# Patient Record
Sex: Female | Born: 1990 | Race: White | Hispanic: No | Marital: Single | Smoking: Never smoker
Health system: Southern US, Community
[De-identification: ages and names within clinical notes are randomized; demographics above are authoritative.]

## PROBLEM LIST (undated history)

## (undated) DIAGNOSIS — Z9109 Other allergy status, other than to drugs and biological substances: Secondary | ICD-10-CM

## (undated) DIAGNOSIS — N390 Urinary tract infection, site not specified: Secondary | ICD-10-CM

## (undated) DIAGNOSIS — N809 Endometriosis, unspecified: Secondary | ICD-10-CM

## (undated) HISTORY — PX: ARTHROSCOPIC REPAIR ACL: SUR80

## (undated) HISTORY — PX: LAPAROSCOPIC ENDOMETRIOSIS FULGURATION: SUR769

---
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
## 2010-04-27 ENCOUNTER — Emergency Department: Payer: Self-pay | Admitting: Emergency Medicine

## 2012-05-14 ENCOUNTER — Emergency Department: Payer: Self-pay | Admitting: Emergency Medicine

## 2012-05-14 LAB — URINALYSIS, COMPLETE
Bilirubin,UR: NEGATIVE
Glucose,UR: NEGATIVE mg/dL (ref 0–75)
Ph: 6 (ref 4.5–8.0)
Protein: 30
Specific Gravity: 1.027 (ref 1.003–1.030)
Squamous Epithelial: 4
WBC UR: 2 /HPF (ref 0–5)

## 2012-05-14 LAB — BASIC METABOLIC PANEL
Anion Gap: 9 (ref 7–16)
BUN: 9 mg/dL (ref 7–18)
Co2: 24 mmol/L (ref 21–32)
Creatinine: 0.94 mg/dL (ref 0.60–1.30)
EGFR (African American): >60
EGFR (Non-African Amer.): >60
Glucose: 101 mg/dL — ABNORMAL HIGH (ref 65–99)
Osmolality: 271 (ref 275–301)
Potassium: 3.3 mmol/L — ABNORMAL LOW (ref 3.5–5.1)
Sodium: 136 mmol/L (ref 136–145)

## 2012-05-14 LAB — CBC
HGB: 15.3 g/dL (ref 12.0–16.0)
MCH: 27.9 pg (ref 26.0–34.0)
MCHC: 33.2 g/dL (ref 32.0–36.0)
RBC: 5.46 10*6/uL — ABNORMAL HIGH (ref 3.80–5.20)
RDW: 12.8 % (ref 11.5–14.5)
WBC: 10.9 10*3/uL (ref 3.6–11.0)

## 2012-05-17 LAB — CBC
HCT: 43.1 % (ref 35.0–47.0)
MCH: 27.9 pg (ref 26.0–34.0)
MCHC: 33.5 g/dL (ref 32.0–36.0)
MCV: 83 fL (ref 80–100)
WBC: 9.8 10*3/uL (ref 3.6–11.0)

## 2012-05-17 LAB — URINALYSIS, COMPLETE
Bilirubin,UR: NEGATIVE
Nitrite: NEGATIVE
Ph: 6 (ref 4.5–8.0)
Specific Gravity: 1.026 (ref 1.003–1.030)

## 2012-05-17 LAB — LIPASE, BLOOD: Lipase: 90 U/L (ref 73–393)

## 2012-05-17 LAB — COMPREHENSIVE METABOLIC PANEL
Anion Gap: 7 (ref 7–16)
BUN: 5 mg/dL — ABNORMAL LOW (ref 7–18)
Bilirubin,Total: 0.4 mg/dL (ref 0.2–1.0)
Calcium, Total: 8.7 mg/dL (ref 8.5–10.1)
Co2: 27 mmol/L (ref 21–32)
Creatinine: 0.63 mg/dL (ref 0.60–1.30)
SGOT(AST): 45 U/L — ABNORMAL HIGH (ref 15–37)
SGPT (ALT): 19 U/L (ref 12–78)
Total Protein: 7.2 g/dL (ref 6.4–8.2)

## 2012-05-18 ENCOUNTER — Inpatient Hospital Stay: Payer: Self-pay | Admitting: Internal Medicine

## 2012-05-18 LAB — BASIC METABOLIC PANEL
Anion Gap: 9 (ref 7–16)
BUN: 2 mg/dL — ABNORMAL LOW (ref 7–18)
Chloride: 105 mmol/L (ref 98–107)
Co2: 23 mmol/L (ref 21–32)
Creatinine: 0.52 mg/dL — ABNORMAL LOW (ref 0.60–1.30)
EGFR (African American): >60
EGFR (Non-African Amer.): >60
Potassium: 3 mmol/L — ABNORMAL LOW (ref 3.5–5.1)
Sodium: 137 mmol/L (ref 136–145)

## 2012-05-18 LAB — CLOSTRIDIUM DIFFICILE BY PCR

## 2012-05-19 LAB — BASIC METABOLIC PANEL
Anion Gap: 6 — ABNORMAL LOW (ref 7–16)
Calcium, Total: 7.6 mg/dL — ABNORMAL LOW (ref 8.5–10.1)
Creatinine: 0.5 mg/dL — ABNORMAL LOW (ref 0.60–1.30)
EGFR (African American): >60
EGFR (Non-African Amer.): >60
Glucose: 123 mg/dL — ABNORMAL HIGH (ref 65–99)
Potassium: 3.2 mmol/L — ABNORMAL LOW (ref 3.5–5.1)

## 2012-05-19 LAB — CBC WITH DIFFERENTIAL/PLATELET
Basophil #: 0 10*3/uL (ref 0.0–0.1)
Basophil %: 0.4 %
HGB: 10.7 g/dL — ABNORMAL LOW (ref 12.0–16.0)
Lymphocyte %: 27.7 %
MCHC: 34.3 g/dL (ref 32.0–36.0)
MCV: 82 fL (ref 80–100)
Monocyte #: 1.4 x10 3/mm — ABNORMAL HIGH (ref 0.2–0.9)
Neutrophil %: 49.3 %
Platelet: 251 10*3/uL (ref 150–440)
WBC: 6.8 10*3/uL (ref 3.6–11.0)

## 2012-05-20 LAB — HEMOGLOBIN: HGB: 12.2 g/dL (ref 12.0–16.0)

## 2013-07-26 IMAGING — CT CT ABD-PELV W/ CM
1 of 2 series · 15 of 32 positions shown, 19 images · IV contrast (isovue)
Comparison: none

REASON FOR EXAM: (1) abd pain, bloody stools; (2) abd pain, bloody stools
COMMENTS:

PROCEDURE:     CT  - CT ABDOMEN / PELVIS  W  - May 18, 2012  [DATE]
RESULT:     Comparison:  None
TECHNIQUE: Multiple axial images of the abdomen and pelvis were performed
from the lung bases to the pubic symphysis, with p.o. contrast and with 80
mL of Isovue 300 intravenous contrast.

[Series 2: 3mm soft tissue · axial · 0.60mm/px · z∈[-1206,-792]mm · 15 of 151 slices shown, 19 images]
[im 7/151  soft-tissue]
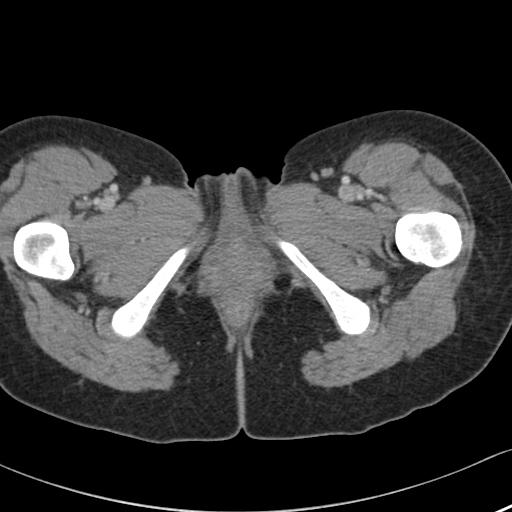
[im 7/151  bone]
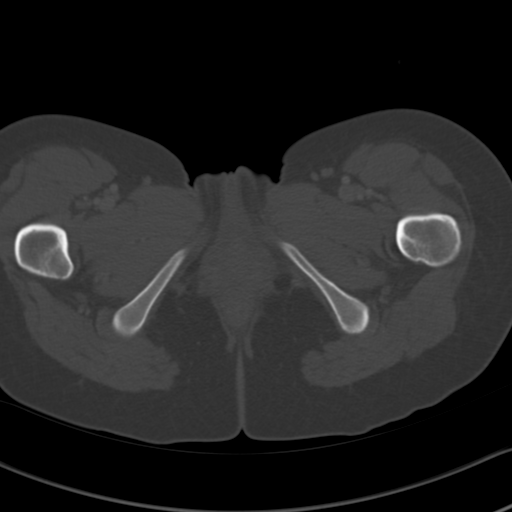
[im 19/151  soft-tissue]
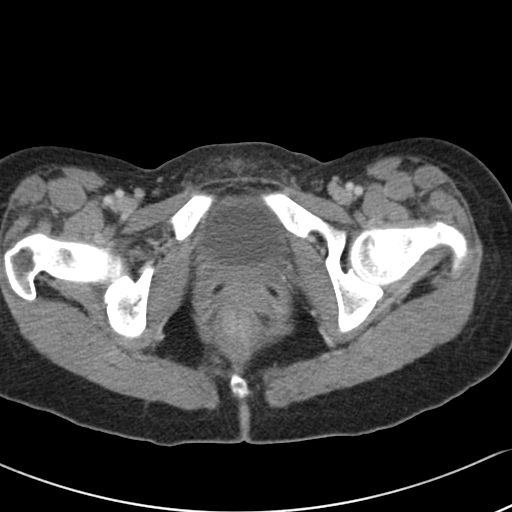
[im 31/151  soft-tissue]
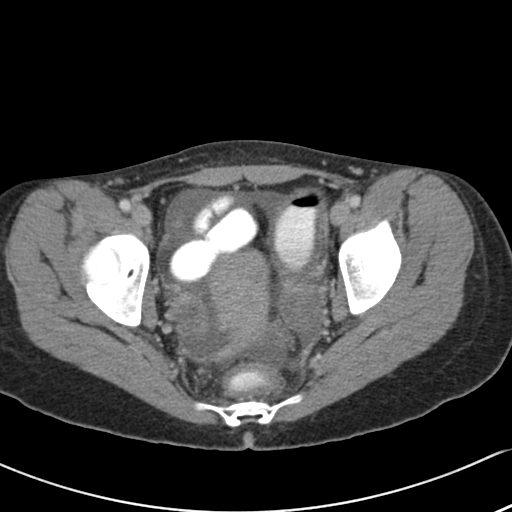
[im 43/151  soft-tissue]
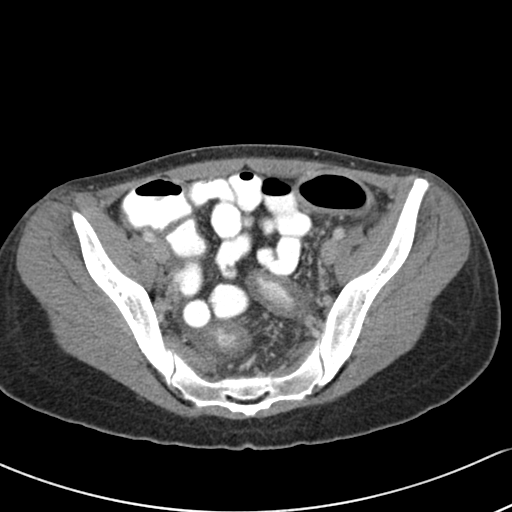
[im 55/151  soft-tissue]
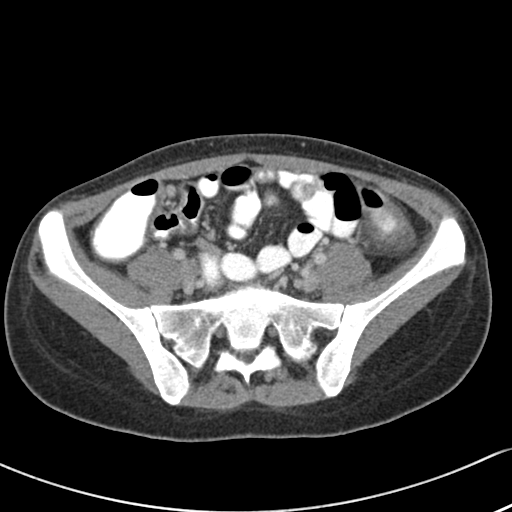
[im 67/151  soft-tissue]
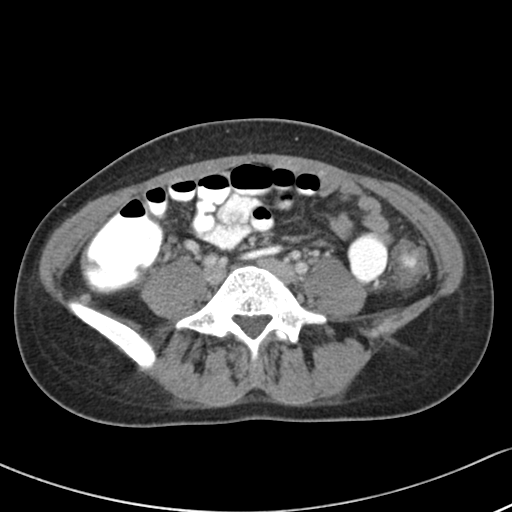
[im 79/151  soft-tissue]
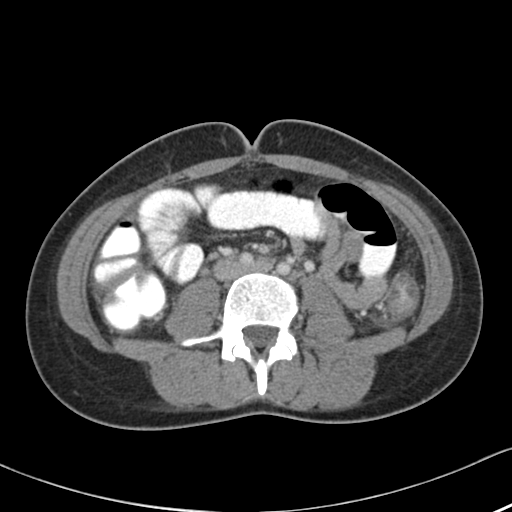
[im 85/151  soft-tissue]
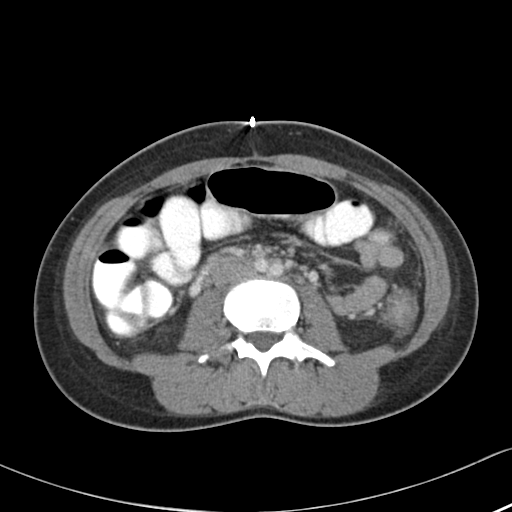
[im 97/151  soft-tissue]
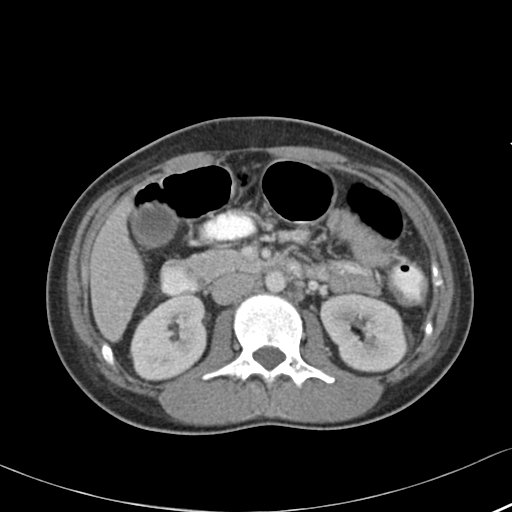
[im 97/151  bone]
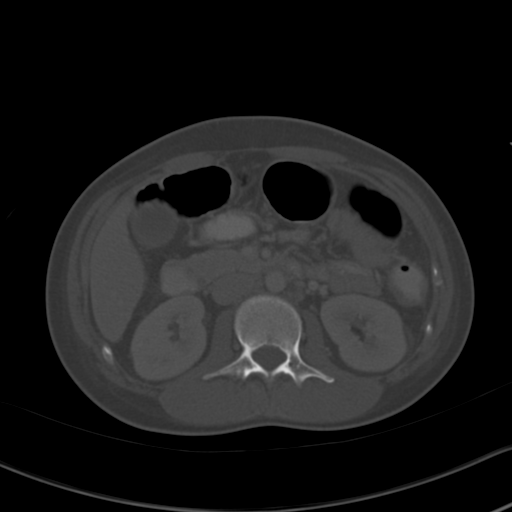
[im 109/151  soft-tissue]
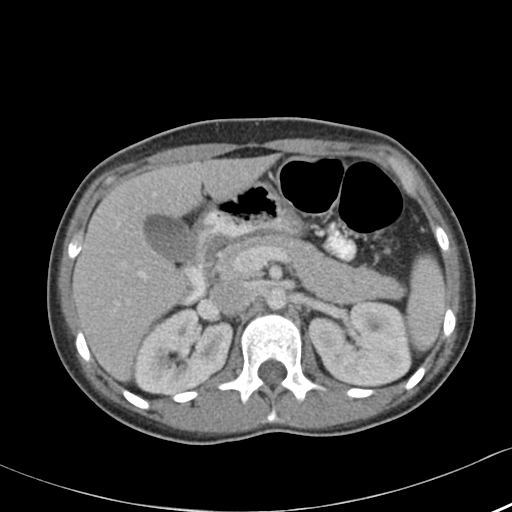
[im 121/151  soft-tissue]
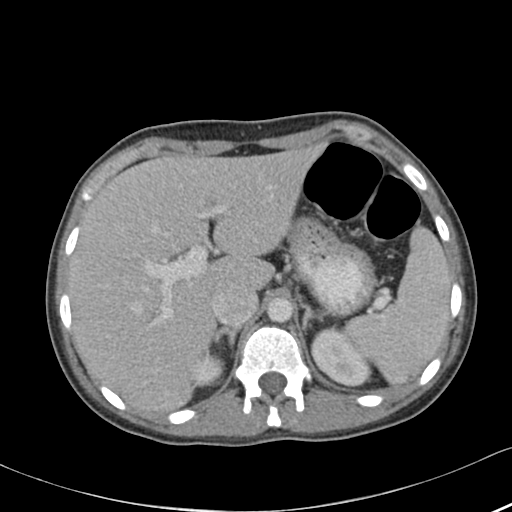
[im 127/151  lung]
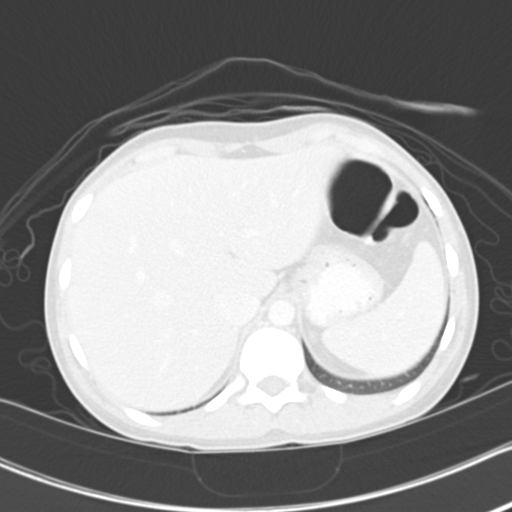
[im 133/151  soft-tissue]
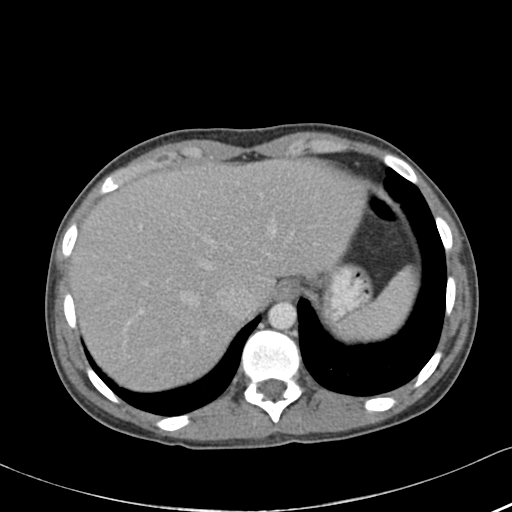
[im 133/151  lung]
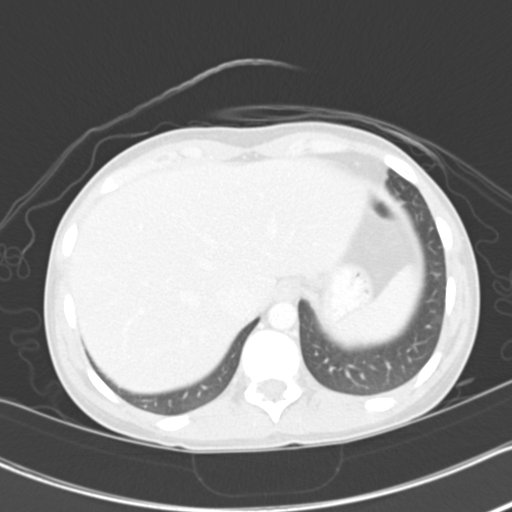
[im 139/151  lung]
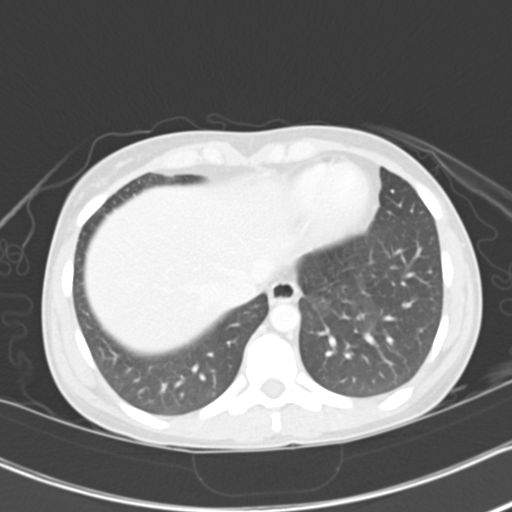
[im 145/151  soft-tissue]
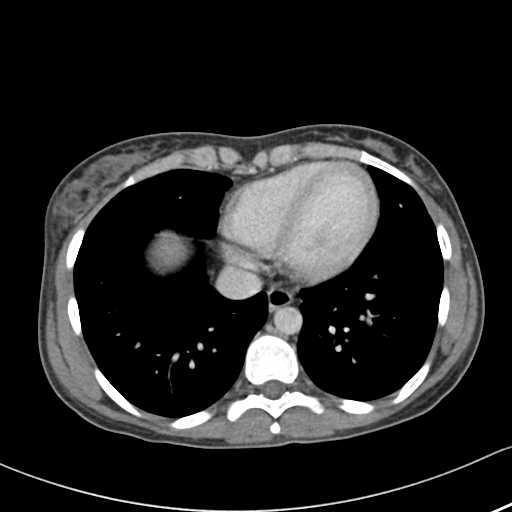
[im 145/151  lung]
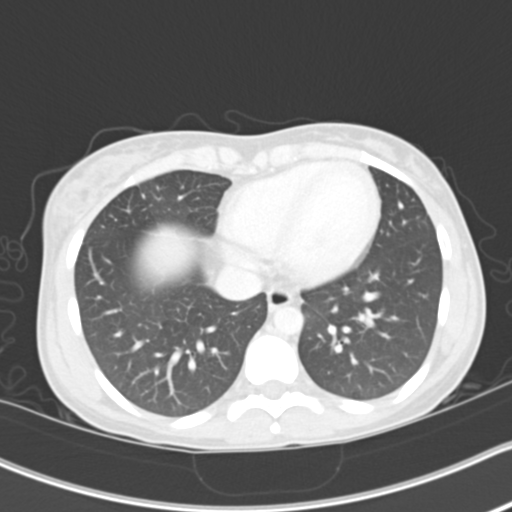

[15 of 32 positions shown; findings below may reference images not displayed]

FINDINGS: There is a 3 mm nodule in the left lower lobe. Mild basilar ground glass
opacities are likely secondary to atelectasis.

The liver, spleen, adrenals, and pancreas are unremarkable. Slight increased
attenuation in the gallbladder may represent sludge. The kidneys enhance
normally.

The small and large bowel are normal in caliber. There is bowel wall
thickening and mild adjacent stranding involving the sigmoid and descending
colon, consistent with colitis. There is a small amount of free fluid in the
pelvis. There is slight patient motion artifact in the mid abdomen. The
appendix is normal. Round lucency surrounding the cervix may represent a
contraceptive device. There are several prominent, but not pathologically
enlarged, lymph nodes in the right lower quadrant.

No aggressive lytic or sclerotic osseous lesions are identified.
IMPRESSION: Colitis involving the sigmoid and descending colon. Differential includes
infectious, inflammatory, and ischemic etiologies.

[REDACTED]

## 2013-08-19 ENCOUNTER — Ambulatory Visit: Payer: Self-pay | Admitting: Family Medicine

## 2013-08-19 LAB — COMPREHENSIVE METABOLIC PANEL
ALBUMIN: 4.1 g/dL (ref 3.4–5.0)
Alkaline Phosphatase: 44 U/L — ABNORMAL LOW
Anion Gap: 9 (ref 7–16)
BILIRUBIN TOTAL: 0.3 mg/dL (ref 0.2–1.0)
BUN: 10 mg/dL (ref 7–18)
CHLORIDE: 103 mmol/L (ref 98–107)
CO2: 27 mmol/L (ref 21–32)
Calcium, Total: 9.3 mg/dL (ref 8.5–10.1)
Creatinine: 0.68 mg/dL (ref 0.60–1.30)
EGFR (Non-African Amer.): >60
Glucose: 91 mg/dL (ref 65–99)
Osmolality: 276 (ref 275–301)
POTASSIUM: 4.6 mmol/L (ref 3.5–5.1)
SGOT(AST): 18 U/L (ref 15–37)
SGPT (ALT): 20 U/L (ref 12–78)
SODIUM: 139 mmol/L (ref 136–145)
TOTAL PROTEIN: 7.6 g/dL (ref 6.4–8.2)

## 2013-08-19 LAB — URINALYSIS, COMPLETE
Bilirubin,UR: NEGATIVE
Glucose,UR: NEGATIVE mg/dL (ref 0–75)
KETONE: NEGATIVE
NITRITE: NEGATIVE
PH: 6 (ref 4.5–8.0)
Protein: NEGATIVE
RBC,UR: NONE SEEN /HPF (ref 0–5)
SPECIFIC GRAVITY: 1.01 (ref 1.003–1.030)

## 2013-08-19 LAB — AMYLASE: Amylase: 66 U/L (ref 25–115)

## 2013-08-19 LAB — CBC WITH DIFFERENTIAL/PLATELET
Basophil #: 0 10*3/uL (ref 0.0–0.1)
Basophil %: 0.5 %
Eosinophil #: 0.2 10*3/uL (ref 0.0–0.7)
Eosinophil %: 2.4 %
HCT: 42.8 % (ref 35.0–47.0)
HGB: 13.8 g/dL (ref 12.0–16.0)
Lymphocyte #: 1.9 10*3/uL (ref 1.0–3.6)
Lymphocyte %: 23.3 %
MCH: 27.5 pg (ref 26.0–34.0)
MCHC: 32.3 g/dL (ref 32.0–36.0)
MCV: 85 fL (ref 80–100)
Monocyte #: 0.5 x10 3/mm (ref 0.2–0.9)
Monocyte %: 6.1 %
NEUTROS PCT: 67.7 %
Neutrophil #: 5.5 10*3/uL (ref 1.4–6.5)
PLATELETS: 293 10*3/uL (ref 150–440)
RBC: 5.03 10*6/uL (ref 3.80–5.20)
RDW: 14 % (ref 11.5–14.5)
WBC: 8.1 10*3/uL (ref 3.6–11.0)

## 2013-08-19 LAB — LIPASE, BLOOD: Lipase: 94 U/L (ref 73–393)

## 2013-08-19 LAB — PREGNANCY, URINE: Pregnancy Test, Urine: NEGATIVE m[IU]/mL

## 2014-06-25 NOTE — Consult Note (Signed)
CC: salmonella colitis infection.  Pt with less diarrhea, less abd pain, no vomiting, passed some blood today.  Hgb stable.  I think she can go home today on Cipro 500mg  bid for 7 more days.  We would like to see in office in 2 days.  Advised her on diet, soft and mushy, bland.  drink lots of fluids, avoid anti-diarrheal meds.  Abd exam with mild tenderness in LLQ and suprapubic area, non distended, no masses.    Electronic Signatures: Scot JunElliott, Markea Ruzich T (MD)  (Signed on 18-Mar-14 16:46)  Authored  Last Updated: 18-Mar-14 16:46 by Scot JunElliott, Hayleigh Bawa T (MD)

## 2014-06-25 NOTE — Discharge Summary (Signed)
  DATE OF BIRTH:  07-Jan-1991  DATE OF ADMISSION:  05/18/2012  DATE OF DISCHARGE:  05/20/2012  PRIMARY CARE PHYSICIAN:  None.   CONSULTS:  Dr. Mechele CollinElliott   DISCHARGE DIAGNOSES: 1.  Salmonella colitis.  2.  Acute blood loss anemia.  3.  Gastrointestinal bleed.  4.  Dehydration.  5.  Hypokalemia.   IMAGING STUDIES:  CT scan of the abdomen and pelvis that showed colitis.   ADMITTING HISTORY AND PHYSICAL AND HOSPITAL COURSE:  Please see detailed H and P dictated by Dr. Imogene Burnhen on 05/17/2012. In brief, a 24 year old Caucasian female patient with no significant past medical history, who presented to the Emergency Room complaining of nausea, vomiting and bloody diarrhea with abdominal pain. The patient was admitted to the hospitalist service for further workup and treatment.   HOSPITAL COURSE:  Salmonella colitis. The patient had a CT scan of the abdomen, which showed colitis. Latest 2  cultures grew Salmonella. The patient was initially started on Levaquin and Flagyl IV, which has been later transitioned to Levaquin p.o. for 7 more days after discharge. On the day of discharge, the patient's diarrhea is much improved. No nausea or vomiting. She is tolerating food, afebrile, white count is normal, and was discharged home in fair condition. The patient will follow up with Dr. Mechele CollinElliott as outpatient. Also, patient had hypokalemia during the hospital stay, which resolved. Mild blood loss, with hemoglobin dropping from 14 to 12, and is stable on the day of discharge.   On the day of discharge, the patient has minimal lower abdominal tenderness. Vitals in the normal range.    DISCHARGE MEDICATIONS INCLUDE:  1.  Levaquin 5 mg oral once a day for one week.  2.  Tramadol 50 mg oral every 8 hours as needed for pain.  3.  Ferrous sulfate 325 mg oral 2 times a day.   DISCHARGE INSTRUCTIONS: She will follow up with primary care physician in a week. Return to the Emergency Room if there is any further bleeding or  worsening abdominal pain or fever. Activity as tolerated.   Time spent on day of discharge in discharge activity was 46 minutes.     ____________________________ Molinda BailiffSrikar R. Jajaira Ruis, MD srs:mr D: 05/22/2012 16:57:00 ET T: 05/22/2012 19:07:39 ET JOB#: [id number]  cc: Wardell HeathSrikar R. Elpidio AnisSudini, MD, <Dictator> Orie FishermanSRIKAR R Robertson Colclough MD ELECTRONICALLY SIGNED 05/28/2012 19:49

## 2014-06-25 NOTE — Consult Note (Signed)
Pt with bloody diarrhea as CC.  CT noted for left sided colitis.  Likely food poisoning, uncertian bug, stools gotten after antibiotics.  Definitely feeling better today.  No bleeding, had about 6 bowel movements today.  Eating crackers ok.  Drinking fluids.  Home either late tomorrow or Wednesday, may follow with us if need out pt follow up.  Start yogurt tomorrow.  Should go home on antibiotics for 5-7 days total..  Electronic Signatures: Scot JunElliott, Donnia Poplaski T (MD)  (Signed on 17-Mar-14 18:03)  Authored  Last Updated: 17-Mar-14 18:03 by Scot JunElliott, Jaskarn Schweer T (MD)

## 2014-06-25 NOTE — Consult Note (Signed)
PATIENT NAME:  Alexa Carter, Alexa Carter MR#:  [id number] DATE OF BIRTH:  May 03, 1990  DATE OF CONSULTATION:  05/18/2012  CONSULTING PHYSICIAN:  Scot Jun, MD  The patient is a 24 year old white female who ate [place] food Monday. She said after she ate, the food sat out at least a couple of hours before it was put in the refrigerator and she ate some again for supper about 7:00 p.m. She awoke about 1:00 a.m. with severe, crampy abdominal pain and copious diarrhea, and then she also developed vomiting multiple times with watery diarrhea occurring about every 30 minutes to an hour. She was unable to eat or drink. She came to a walk-in, [facility], Tuesday. She was told she had a 72-hour stomach bug and was given dissolving pills for nausea. She could not eat or drink anything without getting immediate vomiting. After continuing with this she went to the ER Wednesday night and was found to have  dehydration with a heart rate of 136. She received 3 bags of fluids, Imodium and some pain medicine. Her heart rate decreased to 99. She was discharged. Thursday and Friday continued to have problems. Came back to the ER yesterday and was admitted to the hospital.   She said that she started having bleeding in her bowels twice Wednesday in the stool with bright red blood on the paper and in the water. She had 6 bowel movements last night and there was blood in each of them. Toward the end it was a little darker, more maroon and mixed with the stool. Her last spell of vomiting was about 8 o'clock last night. She is still having diarrhea today and stomach cramps. Now there is continuous pain in the lower abdomen all over. It is worse right before a bowel movement.   ALLERGIES: TO PECANS. PECANS CAUSED HER TO HAVE SWELLING IN HER MOUTH. HER MOTHER HAS THE SAME PROBLEM.   CURRENT MEDICATIONS:  Flagyl 500 mg q. 12 hours, Cipro 500 mg q. 12 hours, Zofran, and Phenergan. She also has been getting some pain  medication.   PAST MEDICAL HISTORY:  Includes endometriosis.   PAST SURGICAL HISTORY: None.   HABITS: Does not smoke. Rarely drinks alcohol.   REVIEW OF SYSTEMS:  No eye inflammation. No mouth sores. At this time she does get ulcers in her mouth from time to time. No shortness of breath or coughing. No chest pains except from vomiting. No arthritic complaints.   EXAMINATION: GENERAL: Young white female, sitting on the bed.  VITAL SIGNS: Temperature 99.2, pulse 106, blood pressure 93/56.   (I am going to dictate the physical exam a little later...she had to run to the bathroom).   LABS: Glucose 114, BUN 2, creatinine 0.52, sodium 137, potassium 3, chloride 105, CO2 of 23, calcium 7.4, magnesium 1.6, total protein 7.2, albumin 2.9, total bili 0.4, alkaline phosphatase 41, SGOT 45, SGPT 19.   White count 10.9 on March 12th;  today 9.8, hemoglobin 14.4. C. diff. study is negative. Urinalysis shows 2+ ketones, 2+ blood. First urine negative leukocyte esterase, second one was trace.   She has a negative pregnancy test.   ASSESSMENT: Very, very likely this represents food poisoning, but considerations are Staphylococcus, Salmonella, Campylobacter, possibly Shigella, possible Escherichia coli. The patient has been given dye to drink for a CAT scan. It likely will show inflammation of the large intestine, possibly the small intestine as well. Her medications are Cipro and Flagyl, both started IV yesterday. She is also getting  Phenergan and morphine if needed, and tramadol orally for abdominal pain.   Because of her hypokalemia she received potassium boluses, and her drip has potassium in it. Await the cultures; however, according to the story she was given antibiotics before she got cultures obtained, therefore the cultures may be false-negative. Her C. diff. study was negative. Given the story, hemorrhagic E. coli is most likely, OH 157.   Will follow with you.       ____________________________ Scot Junobert T. Klinton Candelas, MD rte:dm D: 05/18/2012 13:29:00 ET T: 05/18/2012 13:55:37 ET JOB#: [id number]  cc: Wardell HeathSrikar R. Elpidio AnisSudini, MD Maricela BoLuna Ragsdale, MD Scot Junobert T. Ksean Vale, MD, <Dictator>  Scot JunOBERT T Andrika Peraza MD ELECTRONICALLY SIGNED 05/28/2012 15:01

## 2014-06-25 NOTE — H&P (Signed)
PATIENT NAME:  Alexa, Carter MR#:  [id number] DATE OF BIRTH:  April 13, 1990  DATE OF ADMISSION:  05/17/2012  PRIMARY CARE PHYSICIAN: None local.  REFERRING PHYSICIAN: Maricela Bo, MD  CHIEF COMPLAINT: Abdominal pain, nausea, vomiting, diarrhea for 5 days.   HISTORY OF PRESENT ILLNESS: This is a 24 year old Caucasian female with a history of endometriosis, presented to the ED with above chief complaint. The patient is alert, awake, oriented, in no acute distress. The patient stated that she had lunch in a restaurant last Monday and ate leftover food from the lunch at dinnertime and then in the middle of the night, she developed nausea, vomiting, diarrhea. She vomited multiple times a day with multiple times watery diarrhea, almost every 30 minutes, unable to eat. She came to ED and was treated with Cipro and Flagyl p.o. and sent home, but the patient had the same symptoms without relief, so she came back again. She was given the same treatment and was planned to be discharged home, but still has nausea, vomiting and diarrhea, so Dr. Clemens Catholic admitted the patient for rehydration. The patient was already treated with IV fluid in ED.   PAST MEDICAL HISTORY: Endometriosis.   PAST SURGICAL HISTORY: No.   SOCIAL HISTORY: No smoking or drinking or illicit drugs.   FAMILY HISTORY: Father has diabetes.   REVIEW OF SYSTEMS:   CONSTITUTIONAL: The patient denies any fever or chills. No headache or dizziness, but has generalized weakness.  EYES: No double vision or blurred vision.  ENT: No postnasal drip, slurred speech or dysphagia.  CARDIOVASCULAR: No chest pain, palpitations, orthopnea or nocturnal dyspnea. No leg edema.  PULMONARY: No cough, sputum, shortness of breath or hemoptysis.  GASTROINTESTINAL: Positive for abdominal pain, nausea, vomiting, diarrhea. No melena, but had bloody stool once when patient wiped after diarrhea.  GENITOURINARY: No dysuria, hematuria or incontinence.   NEUROLOGIC: No syncope, loss of consciousness or seizure.  HEMATOLOGIC: No easy bruising, bleeding.   ALLERGIES: PECANS.  HOME MEDICATIONS: Phenergan 12.5 mg p.o. every 6 hours p.r.n., Zofran 4 mg p.o. every 6 to 8 hours p.r.n., Flagyl 500 mg p.o. every 12 hours, Cipro 500 mg p.o. every 12 hours.   PHYSICAL EXAMINATION:  VITALS: Temperature 98.2, blood pressure 94/49, pulse 92, O2 saturation 97% on room air.  GENERAL: The patient is alert, awake, oriented, in no acute distress.  HEENT: Pupils round, equal, reactive to light and accommodation. Moist oral mucosa. Clear oropharynx.  NECK: Supple. No JVD or carotid bruit. No lymphadenopathy. No thyromegaly.  CARDIOVASCULAR: S1, S2, regular rate and rhythm. No murmurs or gallops.  PULMONARY: Bilateral air entry. No wheezing or rales. No use of accessory muscles to breathe.  ABDOMEN: Soft. Bowel sounds present. Diffuse tenderness in lower part of abdomen. No rebound. No rigidity. No organomegaly.  EXTREMITIES: No edema, clubbing or cyanosis. No calf tenderness.  SKIN: No rash or jaundice.  NEUROLOGIC: Alert and oriented x 3. No focal deficit. Power 5/5. Sensation intact. Deep tendon reflexes 2+.   LABORATORY DATA: Urinalysis shows RBC 18, WBC 5, nitrates negative. CBC normal. Lipase 90, glucose 86, BUN 5, creatinine 0.63, sodium 133, potassium 3.4, bicarbonate 27 and bilirubin 0.4. Urine pregnancy test is negative.   IMPRESSION:  1.  Acute gastroenteritis.  2.  Hyponatremia.  3.  Hypokalemia.  PLAN OF TREATMENT: The patient will be placed for observation. We will keep n.p.o. except meds. We will give normal saline IV, start Cipro and Flagyl IVPB, and follow up her stool culture and CDT.  For hypokalemia, we will give potassium IV and follow up BMP and magnesium level. I discussed the patient's condition and the plan of treatment with the patient and the patient's mother.   TIME SPENT: About 45 minutes.   ____________________________ Shaune PollackQing  Nori Poland, MD qc:jm D: 05/17/2012 19:44:16 ET T: 05/17/2012 21:09:56 ET JOB#: [id number]  cc: Shaune PollackQing Jaskirat Zertuche, MD, <Dictator> Shaune PollackQING Delesa Kawa MD ELECTRONICALLY SIGNED 05/19/2012 12:31

## 2015-04-05 LAB — HM PAP SMEAR: HM Pap smear: NEGATIVE

## 2015-06-28 ENCOUNTER — Encounter: Payer: Self-pay | Admitting: *Deleted

## 2015-06-28 ENCOUNTER — Ambulatory Visit
Admission: EM | Admit: 2015-06-28 | Discharge: 2015-06-28 | Disposition: A | Discharge status: Home / Self Care | Payer: BLUE CROSS/BLUE SHIELD | Attending: Family Medicine | Admitting: Family Medicine

## 2015-06-28 DIAGNOSIS — N390 Urinary tract infection, site not specified: Secondary | ICD-10-CM

## 2015-06-28 HISTORY — DX: Endometriosis, unspecified: N80.9

## 2015-06-28 HISTORY — DX: Other allergy status, other than to drugs and biological substances: Z91.09

## 2015-06-28 HISTORY — DX: Urinary tract infection, site not specified: N39.0

## 2015-06-28 LAB — URINALYSIS COMPLETE WITH MICROSCOPIC (ARMC ONLY)
BACTERIA UA: NONE SEEN
BILIRUBIN URINE: NEGATIVE
Glucose, UA: NEGATIVE mg/dL
KETONES UR: NEGATIVE mg/dL
NITRITE: NEGATIVE
PH: 5.5 (ref 5.0–8.0)
Protein, ur: 30 mg/dL — AB
RBC / HPF: NONE SEEN RBC/hpf (ref 0–5)
SPECIFIC GRAVITY, URINE: 1.025 (ref 1.005–1.030)

## 2015-06-28 MED ORDER — NITROFURANTOIN MONOHYD MACRO 100 MG PO CAPS: 100.0000 mg | ORAL_CAPSULE | Freq: Two times a day (BID) | ORAL | Status: DC

## 2015-06-28 MED ORDER — PHENAZOPYRIDINE HCL 200 MG PO TABS: 200.0000 mg | ORAL_TABLET | Freq: Three times a day (TID) | ORAL | Status: DC | PRN

## 2015-06-28 NOTE — ED Notes (Signed)
Patient started having symptoms of burning on urination and urinary frequency 2 days ago. Patient has a history of recurrent UTI.

## 2015-06-28 NOTE — ED Provider Notes (Signed)
CSN: [id number]     Arrival date & time 06/28/15  4098 History   First MD Initiated Contact with Patient 06/28/15 [phone]    Nurses notes were reviewed. Chief Complaint  Patient presents with  . Recurrent UTI    Patient symptoms recurrent UTIs. She states that she gets these UTIs rectal basis. She does have a history of endometriosis.  She does not have any drug allergies she's never smoked and no sniffing family medical history or problems pertinent to today's visit.   (Consider location/radiation/quality/duration/timing/severity/associated sxs/prior Treatment) The history is provided by the patient. No language interpreter was used.    Past Medical History  Diagnosis Date  . Pollen allergies   . UTI (urinary tract infection)   . Endometriosis    Past Surgical History  Procedure Laterality Date  . Arthroscopic repair acl    . Laparoscopic endometriosis fulguration     History reviewed. No pertinent family history. Social History  Substance Use Topics  . Smoking status: Never Smoker   . Smokeless tobacco: Never Used  . Alcohol Use: Yes   OB History    No data available     Review of Systems  Genitourinary: Positive for dysuria, urgency and frequency.  All other systems reviewed and are negative.   Allergies  Peanut-containing drug products  Home Medications   Prior to Admission medications   Medication Sig Start Date End Date Taking? Authorizing Provider  norethindrone-ethinyl estradiol-iron (ESTROSTEP FE,TILIA FE,TRI-LEGEST FE) 1-20/1-30/1-35 MG-MCG tablet Take 1 tablet by mouth daily.   Yes Historical Provider, MD  nitrofurantoin, macrocrystal-monohydrate, (MACROBID) 100 MG capsule Take 1 capsule (100 mg total) by mouth 2 (two) times daily. 06/28/15   Hassan Rowan, MD  phenazopyridine (PYRIDIUM) 200 MG tablet Take 1 tablet (200 mg total) by mouth 3 (three) times daily as needed for pain. 06/28/15   Hassan Rowan, MD   Meds Ordered and Administered this Visit   Medications - No data to display  BP 113/70 mmHg  Pulse 66  Temp(Src) 97.8 F (36.6 C) (Oral)  Resp 18  Ht  (1.6 m)  Wt 115 lb (52.164 kg)  BMI 20.38 kg/m2  SpO2 100%  LMP 06/27/2015 No data found.   Physical Exam  Constitutional: She is oriented to person, place, and time. She appears well-developed and well-nourished.  HENT:  Head: Normocephalic and atraumatic.  Eyes: Conjunctivae are normal. Pupils are equal, round, and reactive to light.  Neck: Neck supple.  Abdominal: Soft. Bowel sounds are normal.  Musculoskeletal: Normal range of motion. She exhibits no edema.  Neurological: She is alert and oriented to person, place, and time.  Skin: Skin is warm and dry.  Psychiatric: She has a normal mood and affect.  Vitals reviewed.   ED Course  Procedures (including critical care time)  Labs Review Labs Reviewed  URINALYSIS COMPLETEWITH MICROSCOPIC ([facility] ONLY) - Abnormal; Notable for the following:    APPearance CLOUDY (*)    Hgb urine dipstick 2+ (*)    Protein, ur 30 (*)    Leukocytes, UA 2+ (*)    Squamous Epithelial / LPF 0-5 (*)    All other components within normal limits  URINE CULTURE    Imaging Review No results found.   Visual Acuity Review  Right Eye Distance:   Left Eye Distance:   Bilateral Distance:    Right Eye Near:   Left Eye Near:    Bilateral Near:      Results for orders placed or performed during the  hospital encounter of 06/28/15  Urinalysis complete, with microscopic  Result Value Ref Range   Color, Urine YELLOW YELLOW   APPearance CLOUDY (A) CLEAR   Glucose, UA NEGATIVE NEGATIVE mg/dL   Bilirubin Urine NEGATIVE NEGATIVE   Ketones, ur NEGATIVE NEGATIVE mg/dL   Specific Gravity, Urine 1.025 1.005 - 1.030   Hgb urine dipstick 2+ (A) NEGATIVE   pH 5.5 5.0 - 8.0   Protein, ur 30 (A) NEGATIVE mg/dL   Nitrite NEGATIVE NEGATIVE   Leukocytes, UA 2+ (A) NEGATIVE   RBC / HPF NONE SEEN 0 - 5 RBC/hpf   WBC, UA TOO NUMEROUS TO  COUNT 0 - 5 WBC/hpf   Bacteria, UA NONE SEEN NONE SEEN   Squamous Epithelial / LPF 0-5 (A) NONE SEEN        MDM   1. UTI (lower urinary tract infection)    We'll place on Macrobid 100 mg 1 capsule twice day for 7 days. Will also add Pyridium 200 mg 3 times a day for 5 days as needed. She states she does not get yeast infections so will not worry about Diflucan and work note for today. Follow-up PCP if not better in 2 weeks.      Hassan RowanEugene Yanna Leaks, MD 06/28/15 1034

## 2015-06-28 NOTE — Discharge Instructions (Signed)
Urine Culture and Sensitivity Testing WHY AM I HAVING THIS TEST?  A urine culture is a test to see if germs grow from your urine sample. Normally, urine is free of germs (sterile). Germs in urine are usually bacteria. Sometimes they can be yeasts. These germs can cause a urinary tract infection (UTI). You may have this test if you have symptoms of a UTI. These may include:  Frequent urination.  Burning pain when passing urine. If you are pregnant, your health care provider may order this test to screen you for a UTI. When you pass urine, the urine flows through the tube that empties your bladder (urethra). In men, urine comes out through an opening at the tip of the penis. In women, it comes out of the body from just above the vaginal opening. These areas may have bacteria near them that normally live on the skin (normal flora). WHAT KIND OF SAMPLE IS TAKEN? A urine sample for a culture test must be collected in a way that keeps normal flora from getting into the sample. The method used most often is called a clean-catch sample. In a few cases, urine may need to be collected directly from the bladder using a thin, flexible tube (catheter). The health care provider puts the catheter through the person's urethra and into the bladder. Your urine sample will be placed onto plates containing a substance that encourages bacteria to grow (agar plates). These plates are kept at body temperature for 24-48 hours to see if bacteria or other germs grow. Then, a lab technician examines them under a microscope to check for germs. Any germs that grow from the culture will be tested against a variety of medicines to find the one that works best (sensitivity testing). For a UTI caused by bacteria, several types of antibiotic medicines may be tested. HOW DO I PREPARE FOR THE TEST?  Do not urinate for about an hour before collecting the sample.  Drink a glass of water about 20 minutes before collecting the  sample.  Tell your health care provider if you have been taking antibiotics. This may affect the results of your test. Your health care provider may give you sterile wipes to clean your vagina or penis to prepare for collecting a clean-catch sample. To collect the sample, you will need to do the following: For Women and Girls  Sit on the toilet and spread the lips of your vagina.  Use one wipe to clean your vaginal area from front to back.  Use a second wipe to clean the opening of your urethra.  Pass a small amount of urine directly into the toilet while still spreading your vagina.  Then, hold the sterile cup underneath you and urinate into it.  Fill the cup about halfway. Cap it and return it for testing. For Men and Boys  Use the sterile wipe to clean the tip of your penis.  Pass a small amount of urine directly into the toilet first.  Then, urinate into the sterile cup.  Fill the cup about halfway. Cap it and return it for testing. WHAT DO THE RESULTS MEAN? The result of a urine culture and sensitivity test will be positive or negative.   If enough bacteria grow from your urine sample, your test result is considered positive.  If many different bacteria grow from your urine sample, your test may be reported as contaminated.  If no bacteria grow from your sample after 24-48 hours, your test result is considered negative.    Results of sensitivity testing let your health care provider know which medicines to use to treat your infection. If the results of your urine culture are negative, this means:  It is less likely that you have a UTI.  Your test may be repeated if you still have symptoms. If the results of your urine culture are positive, this means:  It is more likely that you have a UTI.  You may need to start treatment based on your sensitivity results. Talk to your health care provider to discuss your results, treatment options, and if necessary, the need for  more tests. It is your responsibility to obtain your test results. Ask the lab or department performing the test when and how you will get your results. Talk with your health care provider if you have any questions about your results.   This information is not intended to replace advice given to you by your health care provider. Make sure you discuss any questions you have with your health care provider.   Document Released: 03/16/2004 Document Revised: 03/12/2014 Document Reviewed: 06/18/2013 Elsevier Interactive Patient Education 2016 [employer] Inc.  

## 2015-07-01 LAB — URINE CULTURE

## 2015-11-22 ENCOUNTER — Ambulatory Visit
Admission: EM | Admit: 2015-11-22 | Discharge: 2015-11-22 | Disposition: A | Discharge status: Home / Self Care | Payer: BLUE CROSS/BLUE SHIELD | Attending: Family Medicine | Admitting: Family Medicine

## 2015-11-22 ENCOUNTER — Encounter: Payer: Self-pay | Admitting: *Deleted

## 2015-11-22 DIAGNOSIS — J029 Acute pharyngitis, unspecified: Secondary | ICD-10-CM | POA: Diagnosis not present

## 2015-11-22 DIAGNOSIS — J069 Acute upper respiratory infection, unspecified: Secondary | ICD-10-CM | POA: Diagnosis not present

## 2015-11-22 DIAGNOSIS — B9789 Other viral agents as the cause of diseases classified elsewhere: Secondary | ICD-10-CM

## 2015-11-22 LAB — CBC WITH DIFFERENTIAL/PLATELET
Basophils Absolute: 0.1 10*3/uL (ref 0–0.1)
Basophils Relative: 1 %
EOS PCT: 1 %
Eosinophils Absolute: 0.1 10*3/uL (ref 0–0.7)
HCT: 44.8 % (ref 35.0–47.0)
HEMOGLOBIN: 14.5 g/dL (ref 12.0–16.0)
LYMPHS ABS: 1.6 10*3/uL (ref 1.0–3.6)
LYMPHS PCT: 10 %
MCH: 28.2 pg (ref 26.0–34.0)
MCHC: 32.4 g/dL (ref 32.0–36.0)
MCV: 87 fL (ref 80.0–100.0)
MONOS PCT: 7 %
Monocytes Absolute: 1.1 10*3/uL — ABNORMAL HIGH (ref 0.2–0.9)
Neutro Abs: 13.5 10*3/uL — ABNORMAL HIGH (ref 1.4–6.5)
Neutrophils Relative %: 82 %
PLATELETS: 263 10*3/uL (ref 150–440)
RBC: 5.15 MIL/uL (ref 3.80–5.20)
RDW: 13.2 % (ref 11.5–14.5)
WBC: 16.5 10*3/uL — AB (ref 3.6–11.0)

## 2015-11-22 LAB — RAPID STREP SCREEN (MED CTR MEBANE ONLY): Streptococcus, Group A Screen (Direct): NEGATIVE

## 2015-11-22 LAB — MONONUCLEOSIS SCREEN: MONO SCREEN: NEGATIVE

## 2015-11-22 MED ORDER — FEXOFENADINE-PSEUDOEPHED ER 180-240 MG PO TB24: 1.0000 | ORAL_TABLET | Freq: Every day | ORAL | 0 refills | Status: DC

## 2015-11-22 MED ORDER — AZITHROMYCIN 250 MG PO TABS: ORAL_TABLET | ORAL | 0 refills | Status: AC

## 2015-11-22 NOTE — ED Triage Notes (Signed)
PAtient started having symptoms of sore throat, nasal drainage / congestion, and productive cough last PM. Patient reports coughing up green colored congestion.

## 2015-11-22 NOTE — ED Provider Notes (Signed)
[facility]    CSN: [id number] Arrival date & time: 11/22/15  3086  First Provider Contact:  First MD Initiated Contact with Patient 11/22/15 0913        History   Chief Complaint Chief Complaint  Patient presents with  . Cough  . Sore Throat  . Nasal Congestion    HPI Alexa Carter is a 25 y.o. female.   Patient is a 25 year old white female states that she's been having cough and congestion now for about 2 days she also has a sore throat. She reports the cough is productive of yellowish-green sputum at times and that she's has had a fever but she did not check her temperature  Patient started yesterday and she feels tired and fatigued as well. She does not have any drug allergies.   The history is provided by the patient. No language interpreter was used.  Cough  Cough characteristics:  Hoarse Sputum characteristics:  Green and yellow Onset quality:  Sudden Duration:  2 days Timing:  Intermittent Progression:  Worsening Chronicity:  New Smoker: no   Context: upper respiratory infection   Context: not animal exposure, not exposure to allergens, not fumes and not smoke exposure   Relieved by:  Nothing Worsened by:  Nothing Ineffective treatments:  None tried Associated symptoms: fever, rash, sore throat and wheezing   Associated symptoms: no chest pain, no chills, no diaphoresis, no ear fullness, no ear pain, no eye discharge, no shortness of breath and no sinus congestion   Sore throat:    Severity:  Moderate   Onset quality:  Sudden   Duration:  2 days   Timing:  Constant   Progression:  Worsening Sore Throat  Pertinent negatives include no chest pain and no shortness of breath.    Past Medical History:  Diagnosis Date  . Endometriosis   . Pollen allergies   . UTI (urinary tract infection)     There are no active problems to display for this patient.   Past Surgical History:  Procedure Laterality Date  . ARTHROSCOPIC REPAIR ACL     . LAPAROSCOPIC ENDOMETRIOSIS FULGURATION      OB History    No data available       Home Medications    Prior to Admission medications   Medication Sig Start Date End Date Taking? Authorizing Provider  norethindrone-ethinyl estradiol-iron (ESTROSTEP FE,TILIA FE,TRI-LEGEST FE) 1-20/1-30/1-35 MG-MCG tablet Take 1 tablet by mouth daily.   Yes Historical Provider, MD  nitrofurantoin, macrocrystal-monohydrate, (MACROBID) 100 MG capsule Take 1 capsule (100 mg total) by mouth 2 (two) times daily. 06/28/15   Hassan Rowan, MD  phenazopyridine (PYRIDIUM) 200 MG tablet Take 1 tablet (200 mg total) by mouth 3 (three) times daily as needed for pain. 06/28/15   Hassan Rowan, MD    Family History History reviewed. No pertinent family history.  Social History Social History  Substance Use Topics  . Smoking status: Never Smoker  . Smokeless tobacco: Never Used  . Alcohol use Yes     Allergies   Peanut-containing drug products   Review of Systems Review of Systems  Constitutional: Positive for fever. Negative for chills and diaphoresis.  HENT: Positive for sore throat. Negative for ear pain.   Eyes: Negative for discharge.  Respiratory: Positive for cough and wheezing. Negative for shortness of breath.   Cardiovascular: Negative for chest pain.  Skin: Positive for rash.       States she thinks is poison ivy on her face she  is using an over-the-counter cream she has a small lesion on the right side of her face  All other systems reviewed and are negative.    Physical Exam Triage Vital Signs ED Triage Vitals  Enc Vitals Group     BP 11/22/15 0846 121/73     Pulse Rate 11/22/15 0846 91     Resp 11/22/15 0846 16     Temp 11/22/15 0846 98.6 F (37 C)     Temp Source 11/22/15 0846 Oral     SpO2 11/22/15 0846 100 %     Weight 11/22/15 0848 120 lb (54.4 kg)     Height 11/22/15 0848 5\' 3"  (1.6 m)     Head Circumference --      Peak Flow --      Pain Score 11/22/15 0850 0     Pain  Loc --      Pain Edu? --      Excl. in GC? --    No data found.   Updated Vital Signs BP 121/73 (BP Location: Left Arm)   Pulse 91   Temp 98.6 F (37 C) (Oral)   Resp 16   Ht 5\' 3"  (1.6 m)   Wt 120 lb (54.4 kg)   LMP 11/14/2015   SpO2 100%   BMI 21.26 kg/m   Visual Acuity Right Eye Distance:   Left Eye Distance:   Bilateral Distance:    Right Eye Near:   Left Eye Near:    Bilateral Near:     Physical Exam  Constitutional: She is oriented to person, place, and time. She appears well-developed and well-nourished.  HENT:  Head: Normocephalic and atraumatic.  Eyes: Pupils are equal, round, and reactive to light.  Neck: Normal range of motion. Neck supple.  Cardiovascular: Normal rate, regular rhythm and normal heart sounds.   Pulmonary/Chest: Effort normal and breath sounds normal.  Musculoskeletal: Normal range of motion.  Neurological: She is alert and oriented to person, place, and time.  Skin: Skin is warm and dry. Rash noted. Rash is urticarial. There is erythema. No pallor.     Psychiatric: She has a normal mood and affect.  Vitals reviewed.    UC Treatments / Results  Labs (all labs ordered are listed, but only abnormal results are displayed) Labs Reviewed  RAPID STREP SCREEN (NOT AT [facility])  CULTURE, GROUP A STREP ([facility])  CBC WITH DIFFERENTIAL/PLATELET  MONONUCLEOSIS SCREEN    EKG  EKG Interpretation None       Radiology No results found.  Procedures Procedures (including critical care time)  Medications Ordered in UC Medications - No data to display   Initial Impression / Assessment and Plan / UC Course  I have reviewed the triage vital signs and the nursing notes.  Pertinent labs & imaging results that were available during my care of the patient were reviewed by me and considered in my medical decision making (see chart for details).    Results for orders placed or performed during the hospital encounter of 11/22/15  Rapid strep  screen  Result Value Ref Range   Streptococcus, Group A Screen (Direct) NEGATIVE NEGATIVE    Clinical Course     Patient strep test was negative Monospot test was negative CBC was slightly elevated. Strep culture is pending. Will treat her with Zithromax which she can fill on Thursday if not better by Thursday. Rule out take Allegra-D gargle salt water and give a work note for today and tomorrow. Follow-up with PCP if  not doing better in 1-2 weeks.  Final Clinical Impressions(s) / UC Diagnoses   Final diagnoses:  None    New Prescriptions New Prescriptions   No medications on file     Note: This dictation was prepared with Dragon dictation along with smaller phrase technology. Any transcriptional errors that result from this process are unintentional.   Hassan Rowan, MD 11/22/15 1007

## 2015-11-25 LAB — CULTURE, GROUP A STREP (THRC)

## 2015-11-28 ENCOUNTER — Telehealth: Payer: Self-pay | Admitting: *Deleted

## 2015-11-28 NOTE — Telephone Encounter (Signed)
Called patient and informed her the mono and strep culture tests resulted negative. Patient confirmed understanding of tests results.

## 2016-04-23 ENCOUNTER — Encounter: Payer: Self-pay | Admitting: Urology

## 2016-04-23 ENCOUNTER — Ambulatory Visit: Payer: BLUE CROSS/BLUE SHIELD | Admitting: Urology

## 2016-04-23 VITALS — BP 111/72 | HR 80 | Ht 63.0 in | Wt 123.1 lb

## 2016-04-23 DIAGNOSIS — K5904 Chronic idiopathic constipation: Secondary | ICD-10-CM | POA: Diagnosis not present

## 2016-04-23 DIAGNOSIS — N941 Unspecified dyspareunia: Secondary | ICD-10-CM | POA: Diagnosis not present

## 2016-04-23 DIAGNOSIS — R300 Dysuria: Secondary | ICD-10-CM | POA: Diagnosis not present

## 2016-04-23 LAB — URINALYSIS, COMPLETE
Bilirubin, UA: NEGATIVE
GLUCOSE, UA: NEGATIVE
Ketones, UA: NEGATIVE
LEUKOCYTES UA: NEGATIVE
Nitrite, UA: NEGATIVE
SPEC GRAV UA: 1.025 (ref 1.005–1.030)
Urobilinogen, Ur: 0.2 mg/dL (ref 0.2–1.0)
pH, UA: 6 (ref 5.0–7.5)

## 2016-04-23 LAB — MICROSCOPIC EXAMINATION
Epithelial Cells (non renal): >10 /hpf — ABNORMAL HIGH (ref 0–10)
RBC, UA: NONE SEEN /hpf (ref 0–?)

## 2016-04-23 MED ORDER — DIAZEPAM 10 MG PO TABS
10.0000 mg | ORAL_TABLET | Freq: Every evening | ORAL | 2 refills | Status: DC | PRN
Start: 2016-04-23 — End: 2017-02-21

## 2016-04-23 MED ORDER — MELOXICAM 15 MG PO TABS: 15.0000 mg | ORAL_TABLET | Freq: Every day | ORAL | 2 refills | Status: DC

## 2016-04-23 NOTE — Progress Notes (Signed)
04/23/2016 10:02 AM   Alexa Carter [birth year] [id number]  Referring provider: Shelia Media, MD No address on file  Chief Complaint  Patient presents with  . Dysuria    HPI: 26 year old female who presents to me for further evaluation and management of dysuria. The patient has a history of dysuria and was diagnosed with interstitial cystitis in 2010. 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 Over the past 8 months the patient has had evaluations no less than 5 times for her dysuria. Her workups have largely been unremarkable. However, recently she was seen and evaluated by her gynecologist and a urine culture at that time demonstrated Escherichia coli in her urine. She was treated but her symptoms have not necessarily improved. The patient states that in addition to the dysuria as her urine is a foul-smelling odor. She also intermittently has frequency/urgency. She also intermittently has changes in her stream including a slower stream and more difficult to initiate. She does feel as if she empties her bladder completely. She denies any urinary incontinence. She also has intermittent left lower quadrant pain, constipation as well. And finally, the patient has painful intercourse which is gotten progressively worse over the past few months.  The patient's symptoms seem to correlate to increased stress.     PMH: Past Medical History:  Diagnosis Date  . Endometriosis   . Pollen allergies   . UTI (urinary tract infection)     Surgical History: Past Surgical History:  Procedure Laterality Date  . ARTHROSCOPIC REPAIR ACL    . LAPAROSCOPIC ENDOMETRIOSIS FULGURATION      Home Medications:  Allergies as of 04/23/2016      Reactions   Peanut-containing Drug Products Itching      Medication List       Accurate as of 04/23/16 10:02 AM. Always use your most recent med list.          diazepam 10 MG tablet Commonly known as:  VALIUM Take 1 tablet (10  mg total) by mouth at bedtime as needed (Vaginal pain). Place tablet deep into vagina prior to bedtime   fexofenadine-pseudoephedrine 180-240 MG 24 hr tablet Commonly known as:  ALLEGRA-D ALLERGY & CONGESTION Take 1 tablet by mouth daily.   meloxicam 15 MG tablet Commonly known as:  MOBIC Take 1 tablet (15 mg total) by mouth daily.   nitrofurantoin (macrocrystal-monohydrate) 100 MG capsule Commonly known as:  MACROBID Take 1 capsule (100 mg total) by mouth 2 (two) times daily.   norethindrone-ethinyl estradiol-iron 1-20/1-30/1-35 MG-MCG tablet Commonly known as:  ESTROSTEP FE,TILIA FE,TRI-LEGEST FE Take 1 tablet by mouth daily.   phenazopyridine 200 MG tablet Commonly known as:  PYRIDIUM Take 1 tablet (200 mg total) by mouth 3 (three) times daily as needed for pain.       Allergies:  Allergies  Allergen Reactions  . Peanut-Containing Drug Products Itching    Family History: No family history on file.  Social History:  reports that she has never smoked. She has never used smokeless tobacco. She reports that she drinks alcohol. She reports that she does not use drugs.  ROS: UROLOGY Frequent Urination?: Yes Hard to postpone urination?: Yes Burning/pain with urination?: Yes Get up at night to urinate?: No Leakage of urine?: No Urine stream starts and stops?: No Trouble starting stream?: No Do you have to strain to urinate?: No Blood in urine?: No Urinary tract infection?: Yes Sexually transmitted disease?: No Injury to kidneys or bladder?: No Painful intercourse?: Yes Weak  stream?: No Currently pregnant?: No Vaginal bleeding?: No Last menstrual period?: n  Gastrointestinal Nausea?: No Vomiting?: No Indigestion/heartburn?: No Diarrhea?: No Constipation?: No  Constitutional Fever: No Night sweats?: No Weight loss?: No Fatigue?: No  Skin Skin rash/lesions?: No Itching?: No  Eyes Blurred vision?: No Double vision?: No  Ears/Nose/Throat Sore  throat?: No Sinus problems?: No  Hematologic/Lymphatic Swollen glands?: No Easy bruising?: No  Cardiovascular Leg swelling?: No Chest pain?: No  Respiratory Cough?: No Shortness of breath?: No  Endocrine Excessive thirst?: No  Musculoskeletal Back pain?: No Joint pain?: No  Neurological Headaches?: No Dizziness?: No  Psychologic Depression?: No Anxiety?: No  Physical Exam: BP 111/72   Pulse 80   Ht 5\' 3"  (1.6 m)   Wt 55.8 kg (123 lb 1.6 oz)   BMI 21.81 kg/m   Constitutional:  Alert and oriented, No acute distress. HEENT: [place] AT, moist mucus membranes.  Trachea midline, no masses. Cardiovascular: No clubbing, cyanosis, or edema. Respiratory: Normal respiratory effort, no increased work of breathing. GI: Abdomen is soft, nontender, nondistended, no abdominal masses GU: No CVA tenderness.  Pelvic exam: Deferred, pelvic exam was performed 2 weeks prior by her gynecologist. Her exam is documented as normal. Skin: No rashes, bruises or suspicious lesions. Lymph: No cervical or inguinal adenopathy. Neurologic: Grossly intact, no focal deficits, moving all 4 extremities. Psychiatric: Normal mood and affect.  Laboratory Data: Lab Results  Component Value Date   WBC 16.5 (H) 11/22/2015   HGB 14.5 11/22/2015   HCT 44.8 11/22/2015   MCV 87.0 11/22/2015   PLT 263 11/22/2015    Lab Results  Component Value Date   CREATININE 0.68 08/19/2013    No results found for: PSA  No results found for: TESTOSTERONE  No results found for: HGBA1C  Urinalysis    Component Value Date/Time   COLORURINE YELLOW 06/28/2015 0940   APPEARANCEUR CLOUDY (A) 06/28/2015 0940   APPEARANCEUR HAZY 08/19/2013 0912   LABSPEC 1.025 06/28/2015 0940   LABSPEC 1.010 08/19/2013 0912   PHURINE 5.5 06/28/2015 0940   GLUCOSEU NEGATIVE 06/28/2015 0940   GLUCOSEU NEGATIVE 08/19/2013 0912   HGBUR 2+ (A) 06/28/2015 0940   BILIRUBINUR NEGATIVE 06/28/2015 0940   BILIRUBINUR NEGATIVE 08/19/2013  0912   KETONESUR NEGATIVE 06/28/2015 0940   PROTEINUR 30 (A) 06/28/2015 0940   NITRITE NEGATIVE 06/28/2015 0940   LEUKOCYTESUR 2+ (A) 06/28/2015 0940   LEUKOCYTESUR 1+ 08/19/2013 0912    Pertinent Imaging: None  Assessment & Plan:  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 She also may have intermittent or recurrent urinary tract infections, and is difficult to separate between the 2, especially in light of her recent positive urine culture.  1. Dysuria I discussed with the patient the diagnosis of pelvic floor/voiding dysfunction. I recommended that she be evaluated by pelvic floor physical therapist to help her learn to relax her pelvic floor and the associated pain and voiding symptoms. We discussed using intravaginal Valium initially to help get better control of her symptoms as well as meloxicam 15 mg daily. In addition, we discussed dietary changes to include increased water and less bladder irritants.  We also discussed the possibility of her having recurrent urinary tract infections. We discussed UTI prevention strategies and I recommended that she start taking lactobacillus, probiotic, as well as to cranberry tablets twice daily. We discussed minimizing constipation, and I recommended a bowel regimen to relax daily. We also discussed good perineal/bladder hygiene. - Urinalysis, Complete - Bladder Scan (Post Void Residual) in  office - Ambulatory referral to Physical Therapy  I suspect the patient's symptoms will improve with the above, if she does not have any success or continues to have symptoms within the next 3 months, encouraged her to come back, otherwise she can follow up with us as needed.  Cc: Dr. Delice LeschA Staebler, MD  Return in about 3 months (around 07/21/2016), or if symptoms worsen or fail to improve.  Crist FatHERRICK, Jerianne Anselmo W, MD  [facility] [address], [address] [place], [place] [address] [phone]

## 2016-06-26 ENCOUNTER — Telehealth: Payer: Self-pay

## 2016-06-26 NOTE — Telephone Encounter (Signed)
Pt called stating pharm has sent request for bcp c no response from [place].  I called pharm and gave verbal order.  Pt now has refills thru 04/2017.  Pt aware.

## 2016-07-26 ENCOUNTER — Telehealth: Payer: Self-pay

## 2016-07-26 NOTE — Telephone Encounter (Signed)
Pt brought in paper yesterday to be filled out by [employer] so ins will approve her bc.  She is calling to ck on status of that.  [phone]

## 2016-07-26 NOTE — Telephone Encounter (Signed)
Pt aware Prior Authorization has been faxed to insurance company. She is aware it could take a week or so for me to hear about status. KJ CMA

## 2017-02-21 ENCOUNTER — Other Ambulatory Visit: Payer: Self-pay

## 2017-02-21 ENCOUNTER — Ambulatory Visit
Admission: EM | Admit: 2017-02-21 | Discharge: 2017-02-21 | Disposition: A | Discharge status: Home / Self Care | Payer: BLUE CROSS/BLUE SHIELD | Attending: Family Medicine | Admitting: Family Medicine

## 2017-02-21 ENCOUNTER — Telehealth: Payer: Self-pay | Admitting: *Deleted

## 2017-02-21 ENCOUNTER — Encounter: Payer: Self-pay | Admitting: *Deleted

## 2017-02-21 ENCOUNTER — Telehealth: Payer: Self-pay | Admitting: Emergency Medicine

## 2017-02-21 DIAGNOSIS — J029 Acute pharyngitis, unspecified: Secondary | ICD-10-CM

## 2017-02-21 DIAGNOSIS — B9789 Other viral agents as the cause of diseases classified elsewhere: Secondary | ICD-10-CM

## 2017-02-21 DIAGNOSIS — J069 Acute upper respiratory infection, unspecified: Secondary | ICD-10-CM

## 2017-02-21 DIAGNOSIS — R05 Cough: Secondary | ICD-10-CM

## 2017-02-21 LAB — PREGNANCY, URINE: Preg Test, Ur: NEGATIVE

## 2017-02-21 LAB — RAPID STREP SCREEN (MED CTR MEBANE ONLY): Streptococcus, Group A Screen (Direct): NEGATIVE

## 2017-02-21 MED ORDER — HYDROCOD POLST-CPM POLST ER 10-8 MG/5ML PO SUER: 5.0000 mL | Freq: Every evening | ORAL | 0 refills | Status: DC | PRN

## 2017-02-21 NOTE — Telephone Encounter (Signed)
Patient [facility] pharmacy out of cough medicine.  [facility], and Bennetta LaosStephen RN.

## 2017-02-21 NOTE — Discharge Instructions (Signed)
Take medication as prescribed. Rest. Drink plenty of fluids.  ° °Follow up with your primary care physician this week as needed. Return to Urgent care for new or worsening concerns.  ° °

## 2017-02-21 NOTE — ED Provider Notes (Signed)
[facility] ____________________________________________  Time seen: Approximately 8:31 AM  I have reviewed the triage vital signs and the nursing notes.   HISTORY  Chief Complaint Nasal Congestion and Sore Throat   HPI Alexa Carter is a 26 y.o. female presenting for evaluation of 2 days of runny nose, nasal congestion, cough.  Reports greenish nasal drainage.  States some sinus pressure discomfort.  States initially started out with a sore throat, denies sore throat now.  Reports continues to drink fluids well, slight decrease in appetite.  States has taken some over-the-counter Mucinex D without much change of symptoms.  Denies known fevers.  States her husband recently had some similar complaints.  Denies other known sick contacts.  Denies other aggravating or alleviating factors.  Reports otherwise feels well. Denies chest pain, shortness of breath, abdominal pain, dysuria, or rash. Denies recent sickness. Denies recent antibiotic use.   Patient's last menstrual period was 01/22/2017.  Denies pregnancy, but states would like to do a pregnancy test just to make sure.   Past Medical History:  Diagnosis Date  . Endometriosis   . Pollen allergies   . UTI (urinary tract infection)     There are no active problems to display for this patient.   Past Surgical History:  Procedure Laterality Date  . ARTHROSCOPIC REPAIR ACL    . LAPAROSCOPIC ENDOMETRIOSIS FULGURATION       No current facility-administered medications for this encounter.   Current Outpatient Medications:  .  norethindrone-ethinyl estradiol-iron (ESTROSTEP FE,TILIA FE,TRI-LEGEST FE) 1-20/1-30/1-35 MG-MCG tablet, Take 1 tablet by mouth daily., Disp: , Rfl:  .  chlorpheniramine-HYDROcodone (TUSSIONEX PENNKINETIC ER) 10-8 MG/5ML SUER, Take 5 mLs by mouth at bedtime as needed for cough. do not drive or operate machinery while taking as can cause drowsiness., Disp: 75 mL, Rfl:  0  Allergies Peanut-containing drug products  History reviewed. No pertinent family history.  Social History Social History   Tobacco Use  . Smoking status: Never Smoker  . Smokeless tobacco: Never Used  Substance Use Topics  . Alcohol use: Yes  . Drug use: No    Review of Systems Constitutional: No fever/chills ENT: As above. Cardiovascular: Denies chest pain. Respiratory: Denies shortness of breath. Gastrointestinal: No abdominal pain.  No nausea, no vomiting.   Genitourinary: Negative for dysuria. Musculoskeletal: Negative for back pain. Skin: Negative for rash.   ____________________________________________   PHYSICAL EXAM:  VITAL SIGNS: ED Triage Vitals  Enc Vitals Group     BP 02/21/17 0816 113/71     Pulse Rate 02/21/17 0816 92     Resp 02/21/17 0816 16     Temp 02/21/17 0816 97.9 F (36.6 C)     Temp Source 02/21/17 0816 Oral     SpO2 02/21/17 0816 100 %     Weight 02/21/17 0815 120 lb (54.4 kg)     Height 02/21/17 0815 5\' 3"  (1.6 m)     Head Circumference --      Peak Flow --      Pain Score 02/21/17 0816 5     Pain Loc --      Pain Edu? --      Excl. in GC? --     Constitutional: Alert and oriented. Well appearing and in no acute distress. Eyes: Conjunctivae are normal.  Head: Atraumatic. No sinus tenderness to palpation. No swelling. No erythema.  Ears: no erythema, normal TMs bilaterally.   Nose:Nasal congestion with clear rhinorrhea  Mouth/Throat: Mucous membranes are moist. No pharyngeal  erythema. No tonsillar swelling or exudate.  Neck: No stridor.  No cervical spine tenderness to palpation. Hematological/Lymphatic/Immunilogical: No cervical lymphadenopathy. Cardiovascular: Normal rate, regular rhythm. Grossly normal heart sounds.  Good peripheral circulation. Respiratory: Normal respiratory effort.  No retractions. No wheezes, rales or rhonchi. Good air movement.  Musculoskeletal: Ambulatory with steady gait. No cervical, thoracic or  lumbar tenderness to palpation. Neurologic:  Normal speech and language. No gait instability. Skin:  Skin appears warm, dry and intact. No rash noted. Psychiatric: Mood and affect are normal. Speech and behavior are normal. ___________________________________________   LABS (all labs ordered are listed, but only abnormal results are displayed)  Labs Reviewed  RAPID STREP SCREEN (NOT AT [facility])  CULTURE, GROUP A STREP [facility]  PREGNANCY, URINE    PROCEDURES Procedures   INITIAL IMPRESSION / ASSESSMENT AND PLAN / ED COURSE  Pertinent labs & imaging results that were available during my care of the patient were reviewed by me and considered in my medical decision making (see chart for details).  Well-appearing patient.  No acute distress.  Quick strep negative, will culture.  Urine pregnancy test negative.  Suspect viral upper respiratory infection.  Encouraged rest, fluids, supportive care, over-the-counter Sudafed as needed and as needed. Tussionex as needed.  Work note given for today and tomorrow.Discussed indication, risks and benefits of medications with patient.  Discussed follow up with Primary care physician this week as needed. Discussed follow up and return parameters including no resolution or any worsening concerns. Patient verbalized understanding and agreed to plan.   ____________________________________________   FINAL CLINICAL IMPRESSION(S) / ED DIAGNOSES  Final diagnoses:  Viral URI with cough     ED Discharge Orders        Ordered    chlorpheniramine-HYDROcodone (TUSSIONEX PENNKINETIC ER) 10-8 MG/5ML SUER  At bedtime PRN     02/21/17 0910       Note: This dictation was prepared with Dragon dictation along with smaller phrase technology. Any transcriptional errors that result from this process are unintentional.         Renford DillsMiller, Leilany Digeronimo, NP 02/21/17 [phone]

## 2017-02-21 NOTE — ED Triage Notes (Signed)
Patient started having symptom of sore throat, nasal congestion and cough 2 days ago.

## 2017-02-23 LAB — CULTURE, GROUP A STREP (THRC)

## 2017-03-08 ENCOUNTER — Ambulatory Visit (INDEPENDENT_AMBULATORY_CARE_PROVIDER_SITE_OTHER): Payer: BLUE CROSS/BLUE SHIELD | Admitting: Maternal Newborn

## 2017-03-08 ENCOUNTER — Encounter: Payer: Self-pay | Admitting: Maternal Newborn

## 2017-03-08 VITALS — BP 110/80 | HR 72 | Ht 63.0 in | Wt 124.0 lb

## 2017-03-08 DIAGNOSIS — Z124 Encounter for screening for malignant neoplasm of cervix: Secondary | ICD-10-CM | POA: Diagnosis not present

## 2017-03-08 DIAGNOSIS — Z3041 Encounter for surveillance of contraceptive pills: Secondary | ICD-10-CM

## 2017-03-08 DIAGNOSIS — Z01419 Encounter for gynecological examination (general) (routine) without abnormal findings: Secondary | ICD-10-CM

## 2017-03-08 MED ORDER — LO LOESTRIN FE 1 MG-10 MCG / 10 MCG PO TABS: 1.0000 | ORAL_TABLET | Freq: Every day | ORAL | 11 refills | Status: AC

## 2017-03-08 NOTE — Progress Notes (Signed)
Gynecology Annual Exam  PCP: Patient, No Pcp Per  Chief Complaint:  Chief Complaint  Patient presents with  . Gynecologic Exam    History of Present Illness: Patient is a 27 y.o. G0P0000 presents for annual exam. The patient has no complaints today.   LMP: Patient's last menstrual period was 02/23/2016. Average Interval: regular, 28 days Duration of flow: 5 days Heavy Menses: no Clots: no Intermenstrual Bleeding: yes Postcoital Bleeding: no Dysmenorrhea: yes  The patient is sexually active. She currently uses OCP (estrogen/progesterone) for contraception. She denies dyspareunia.  The patient does not perform self breast exams.  There is no notable family history of breast or ovarian cancer in her family.  The patient wears seatbelts: yes.   The patient has regular exercise: yes.    The patient denies current symptoms of depression.    Review of Systems  Constitutional: Negative for fever and weight loss.  HENT: Negative.   Eyes: Negative for blurred vision and pain.  Respiratory: Negative for cough, shortness of breath and wheezing.   Cardiovascular: Negative for chest pain and palpitations.  Gastrointestinal: Negative for abdominal pain, constipation, diarrhea, heartburn and nausea.  Genitourinary:       Sometimes has pelvic pain with endometrosis, especially during cycle  Musculoskeletal: Negative.   Skin: Negative.   Neurological: Negative for dizziness and headaches.  Endo/Heme/Allergies: Negative.   Psychiatric/Behavioral: Negative for depression. The patient is not nervous/anxious.   All other systems reviewed and are negative.   Past Medical History:  Past Medical History:  Diagnosis Date  . Endometriosis   . Pollen allergies   . UTI (urinary tract infection)     Past Surgical History:  Past Surgical History:  Procedure Laterality Date  . ARTHROSCOPIC REPAIR ACL    . LAPAROSCOPIC ENDOMETRIOSIS FULGURATION      Gynecologic History:  Patient's  last menstrual period was 02/23/2016. Contraception: OCP (estrogen/progesterone) Last Pap: 04/05/2015. Results were: NIL  Obstetric History: G0P0000  Family History:  Family History  Problem Relation Age of Onset  . Diabetes Father   . Diabetes Maternal Grandmother     Social History:  Social History   Socioeconomic History  . Marital status: Single    Spouse name: Not on file  . Number of children: Not on file  . Years of education: Not on file  . Highest education level: Not on file  Social Needs  . Financial resource strain: Not on file  . Food insecurity - worry: Not on file  . Food insecurity - inability: Not on file  . Transportation needs - medical: Not on file  . Transportation needs - non-medical: Not on file  Occupational History  . Not on file  Tobacco Use  . Smoking status: Never Smoker  . Smokeless tobacco: Never Used  Substance and Sexual Activity  . Alcohol use: Yes  . Drug use: No  . Sexual activity: Yes    Birth control/protection: Pill  Other Topics Concern  . Not on file  Social History Narrative  . Not on file    Allergies:  Allergies  Allergen Reactions  . Peanut-Containing Drug Products Itching  . Pollen Extract     Medications: Prior to Admission medications   Medication Sig Start Date End Date Taking? Authorizing Provider  chlorpheniramine-HYDROcodone (TUSSIONEX) 10-8 MG/5ML SUER Take 5 mLs by mouth at bedtime. 02/21/17  Yes [provider]  LO LOESTRIN FE 1 MG-10 MCG / 10 MCG tablet Take 1 tablet by mouth daily. 02/20/17  Yes [provider]    Physical Exam Vitals: Blood pressure 110/80, pulse 72, height 5\' 3"  (1.6 m), weight 124 lb (56.2 kg), last menstrual period 02/23/2016.  General: NAD HEENT: normocephalic, anicteric Thyroid: no enlargement, no palpable nodules Pulmonary: No increased work of breathing, CTAB Cardiovascular: RRR, distal pulses 2+ Breast: Breast symmetrical, no tenderness, no palpable  nodules or masses, no skin or nipple retraction present, no nipple discharge.  No axillary or supraclavicular lymphadenopathy. Abdomen: NABS, soft, non-tender, non-distended.  Umbilicus without lesions.  No hepatomegaly, splenomegaly or masses palpable. No evidence of hernia  Genitourinary:  External: Normal external female genitalia.  Normal urethral meatus, normal   Bartholin's and Skene's glands.    Vagina: Normal vaginal mucosa, no evidence of prolapse.    Cervix: Grossly normal in appearance, no bleeding  Uterus: Non-enlarged, mobile, normal contour.  No CMT  Adnexa: ovaries non-enlarged, no adnexal masses  Rectal: deferred  Lymphatic: no evidence of inguinal lymphadenopathy Extremities: no edema, erythema, or tenderness Neurologic: Grossly intact Psychiatric: mood appropriate, affect full  Assessment: 27 y.o. G0P0000 routine annual exam and renewal for OCPs.  Plan: Problem List Items Addressed This Visit    None    Visit Diagnoses    Encounter for annual routine gynecological examination    -  Primary   Encounter for surveillance of contraceptive pills       Relevant Medications   LO LOESTRIN FE 1 MG-10 MCG / 10 MCG tablet   Pap smear for cervical cancer screening       Relevant Orders   Pap IG w/ reflex to HPV when [facility]      1) STI screening was offered and declined.  2) ASCCP guidelines and rationale discussed.  Patient opts for yearly screening interval.  3) Contraception - Patient is satisfied with her current method ( oral contraceptives). Discussed ACHES. Paper Rx given as patient is moving to [place] next week. Considering conception in the near future. Advised to avoid teratogens and begin prenatal vitamins/folate.  4) Routine healthcare maintenance including cholesterol, diabetes screening discussed: Declines.  5) Follow up 1 year for routine annual exam with provider in her new location.  Marcelyn Bruins, CNM 03/09/2017  8:17 AM

## 2017-03-09 ENCOUNTER — Encounter: Payer: Self-pay | Admitting: Maternal Newborn

## 2017-03-12 LAB — PAP IG W/ RFLX HPV ASCU: PAP Smear Comment: 0
